# Patient Record
Sex: Male | Born: 1975 | Race: White | Hispanic: No | Marital: Married | Smoking: Never smoker
Health system: Southern US, Community
[De-identification: ages and names within clinical notes are randomized; demographics above are authoritative.]

## PROBLEM LIST (undated history)

## (undated) HISTORY — PX: NO PAST SURGERIES: SHX2092

---

## 2009-12-14 ENCOUNTER — Ambulatory Visit: Payer: Self-pay | Admitting: Internal Medicine

## 2016-11-29 ENCOUNTER — Ambulatory Visit (INDEPENDENT_AMBULATORY_CARE_PROVIDER_SITE_OTHER): Payer: BLUE CROSS/BLUE SHIELD

## 2016-11-29 ENCOUNTER — Ambulatory Visit
Admission: EM | Admit: 2016-11-29 | Discharge: 2016-11-29 | Disposition: A | Discharge status: Home / Self Care | Payer: BLUE CROSS/BLUE SHIELD | Attending: Family Medicine | Admitting: Family Medicine

## 2016-11-29 DIAGNOSIS — M25572 Pain in left ankle and joints of left foot: Secondary | ICD-10-CM | POA: Diagnosis not present

## 2016-11-29 MED ORDER — MELOXICAM 15 MG PO TABS: 15.0000 mg | ORAL_TABLET | Freq: Every day | ORAL | 0 refills | Status: AC | PRN

## 2016-11-29 NOTE — ED Provider Notes (Signed)
[facility] ____________________________________________  Time seen: Approximately 1:02 PM  I have reviewed the triage vital signs and the nursing notes.   HISTORY  Chief Complaint Foot Pain (left)  HPI Brent Adkins is a 41 y.o. male presenting for evaluation of left ankle pain for the last 2 days. Patient reports this past Saturday he was fishing all day with a lot of movement and stabilizing himself on the boat, but denies specific injury. Patient reports since Saturday evening after fishing he has been having left ankle pain. States pain is only with direct palpation or with walking. States if he sitting still he does not have any pain to his left ankle. Denies pain radiation, paresthesias, skin changes or rash. Denies any other joint pain. Denies fevers. Reports did have a tick bite on left abdomen 3 weeks ago but reports feeling well, no rash or other Complications. Patient reports pain at maximum is 5 out of 10. Denies previous issues to his ankle. Patient's spouse states he just wanted to make sure that no fracture. Patient does report he has a history of gout but states that this does not feel similar to previous gout.  Denies chest pain, shortness of breath, abdominal pain, dysuria.Denies recent sickness. Denies recent antibiotic use.   Kandyce Rud, MD: PCP   History reviewed. No pertinent past medical history.  There are no active problems to display for this patient.   Past Surgical History:  Procedure Laterality Date  . NO PAST SURGERIES       No current facility-administered medications for this encounter.   Current Outpatient Prescriptions:  .  meloxicam (MOBIC) 15 MG tablet, Take 1 tablet (15 mg total) by mouth daily as needed., Disp: 10 tablet, Rfl: 0  Allergies Patient has no known allergies.  History reviewed. No pertinent family history.  Social History Social History  Substance Use Topics  . Smoking status: Never Smoker  .  Smokeless tobacco: Never Used  . Alcohol use No    Review of Systems Constitutional: No fever/chills. Cardiovascular: Denies chest pain. Respiratory: Denies shortness of breath.  Genitourinary: Negative for dysuria. Musculoskeletal: Negative for back pain. As above. Skin: Negative for rash.  ____________________________________________   PHYSICAL EXAM:  VITAL SIGNS: ED Triage Vitals  Enc Vitals Group     BP 11/29/16 1149 130/84     Pulse Rate 11/29/16 1149 60     Resp 11/29/16 1149 16     Temp 11/29/16 1149 98.6 F (37 C)     Temp Source 11/29/16 1149 Oral     SpO2 11/29/16 1149 99 %     Weight 11/29/16 1148 212 lb (96.2 kg)     Height 11/29/16 1148 5\' 8"  (1.727 m)     Head Circumference --      Peak Flow --      Pain Score 11/29/16 1148 3     Pain Loc --      Pain Edu? --      Excl. in GC? --     Constitutional: Alert and oriented. Well appearing and in no acute distress. Cardiovascular: Normal rate, regular rhythm. Grossly normal heart sounds.  Good peripheral circulation. Respiratory: Normal respiratory effort without tachypnea nor retractions. Breath sounds are clear and equal bilaterally. No wheezes, rales, rhonchi. Musculoskeletal:  No midline cervical, thoracic or lumbar tenderness to palpation. Bilateral pedal pulses equal and easily palpated. except: Left lateral malleolus and immediate surrounding area mild tenderness to direct palpation, no swelling, no ecchymosis, pain with ankle  rotation, but full range of motion present, no pain with left foot plantar flexion or dorsiflexion, no erythema or rash. Left lower extremity otherwise nontender. Neurologic:  Normal speech and language. Speech is normal.  Skin:  Skin is warm, dry and intact. No rash noted. Psychiatric: Mood and affect are normal. Speech and behavior are normal. Patient exhibits appropriate insight and judgment.    ___________________________________________   LABS (all labs ordered are listed,  but only abnormal results are displayed)  Labs Reviewed - No data to display ____________________________________________  RADIOLOGY  Dg Ankle Complete Left  Result Date: 11/29/2016 CLINICAL DATA:  Pain for 2 days EXAM: LEFT ANKLE COMPLETE - 3+ VIEW COMPARISON:  None. FINDINGS: Frontal, oblique, and lateral views were obtained. There is mild soft tissue swelling. No acute fracture or joint effusion. Calcification in the medial malleolar region is well corticated and likely residua of old trauma in this area. There is no appreciable joint space narrowing or erosion. IMPRESSION: Probable old trauma medial malleolus with well corticated focus of ossification. No acute fracture or joint effusion. Ankle mortise appears intact. There is mild soft tissue swelling. Electronically Signed   By: Bretta BangWilliam  Woodruff III M.D.   On: 11/29/2016 13:24   ____________________________________________   PROCEDURES Procedures   Velcro stirrup splint applied by RN. INITIAL IMPRESSION / ASSESSMENT AND PLAN / ED COURSE  Pertinent labs & imaging results that were available during my care of the patient were reviewed by me and considered in my medical decision making (see chart for details).  Well appearing patient. No acute distress. Left ankle pain only with direct palpation and ambulation. Denies other pain or injuries. Left ankle x-ray per radiologist probable old trauma medial malleolus and well corticated focus of ossification, no acute fracture or joint effusion. Suspect sprain injury. No medial tenderness. Encouraged splint, rest, ice and elevation. Denies need for crutches.  Patient had reported recent tick bite healing well, discussed the patient doubt related to current joint pain, but discuss [facility], patient declined lab evaluation. Will treat with with oral daily Mobic. Discussed indication, risks and benefits of medications with patient.  Discussed follow up  with Primary care physician this week. Discussed follow up and return parameters including no resolution or any worsening concerns. Patient verbalized understanding and agreed to plan.   ____________________________________________   FINAL CLINICAL IMPRESSION(S) / ED DIAGNOSES  Final diagnoses:  Acute left ankle pain     Discharge Medication List as of 11/29/2016  1:42 PM    START taking these medications   Details  meloxicam (MOBIC) 15 MG tablet Take 1 tablet (15 mg total) by mouth daily as needed., Starting Mon 11/29/2016, Normal        Note: This dictation was prepared with Dragon dictation along with smaller phrase technology. Any transcriptional errors that result from this process are unintentional.         Renford DillsMiller, Jame Morrell, NP 11/29/16 1703

## 2016-11-29 NOTE — ED Triage Notes (Signed)
Patient complains of left foot pain underneath ankle. Patient states that this started on Saturday evening with no known injury.

## 2016-11-29 NOTE — Discharge Instructions (Signed)
Take medication as prescribed. Rest. Ice. Drink plenty of fluids.   Follow up with your primary care physician or above this week as needed. Return to Urgent care for new or worsening concerns.

## 2018-11-11 IMAGING — CR DG ANKLE COMPLETE 3+V*L*
3 series · 3 of 3 positions shown · non-contrast
Comparison: None.

CLINICAL DATA: Pain for 2 days

EXAM:
LEFT ANKLE COMPLETE - 3+ VIEW

[ankle ap]
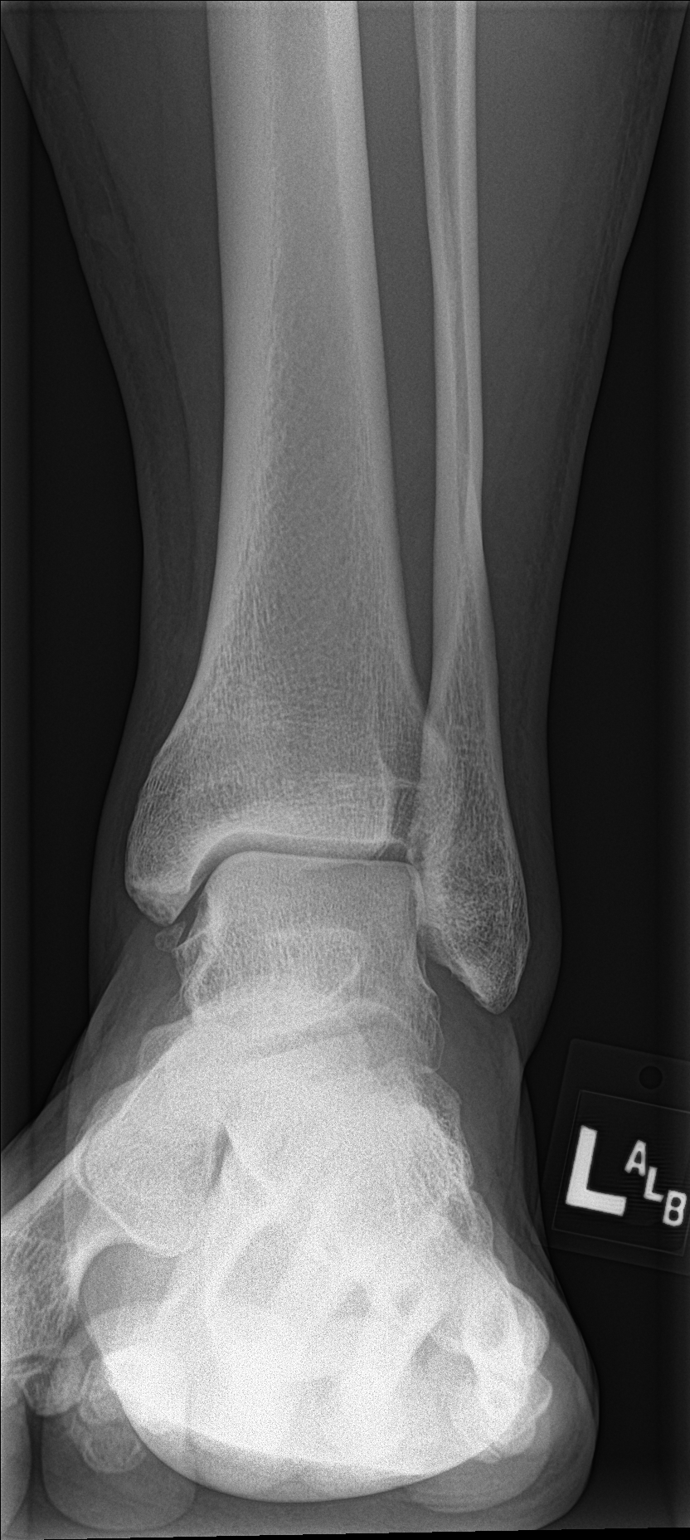

[ankle obl]
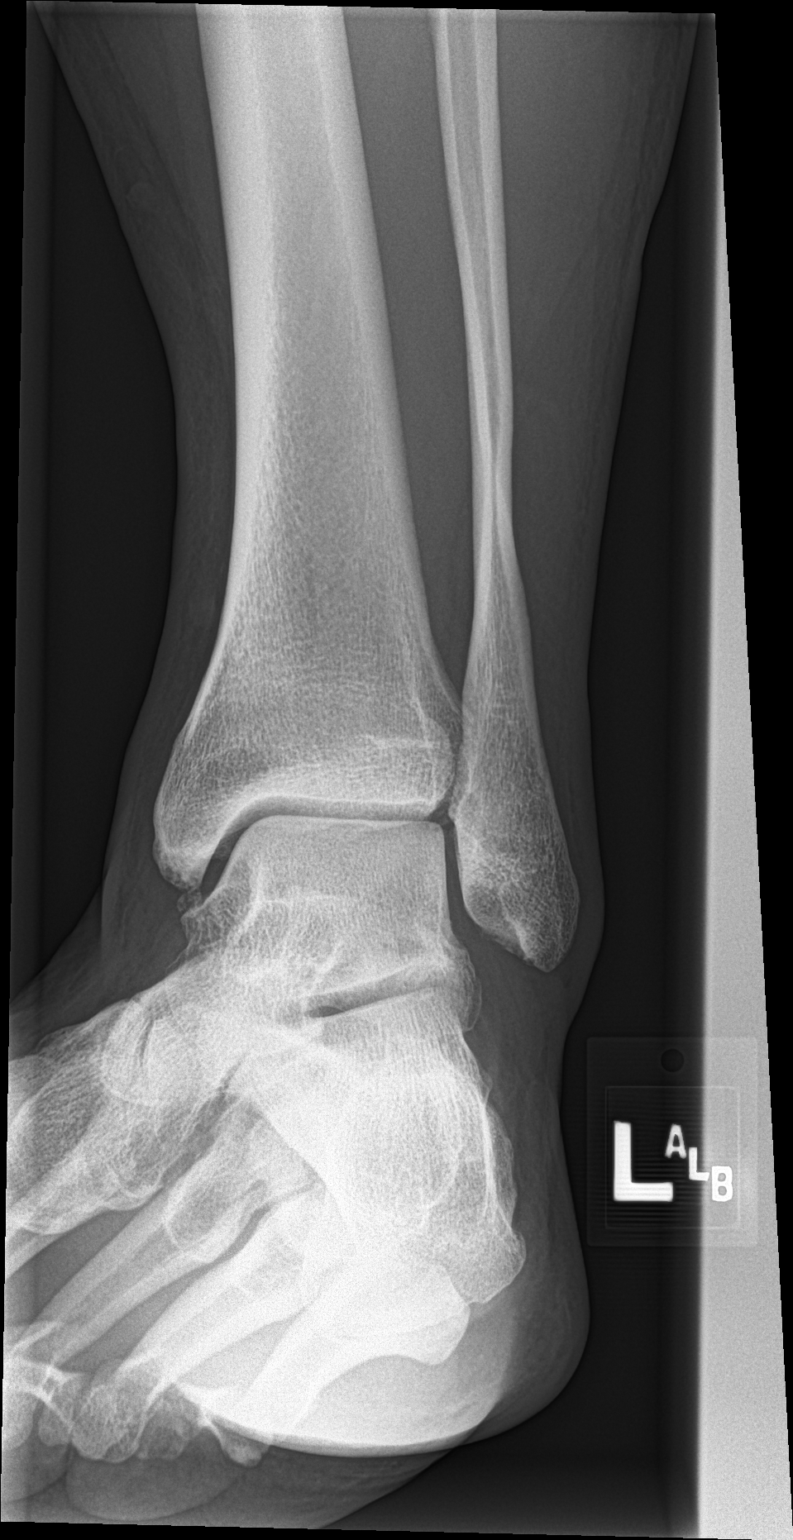

[ankle lat]
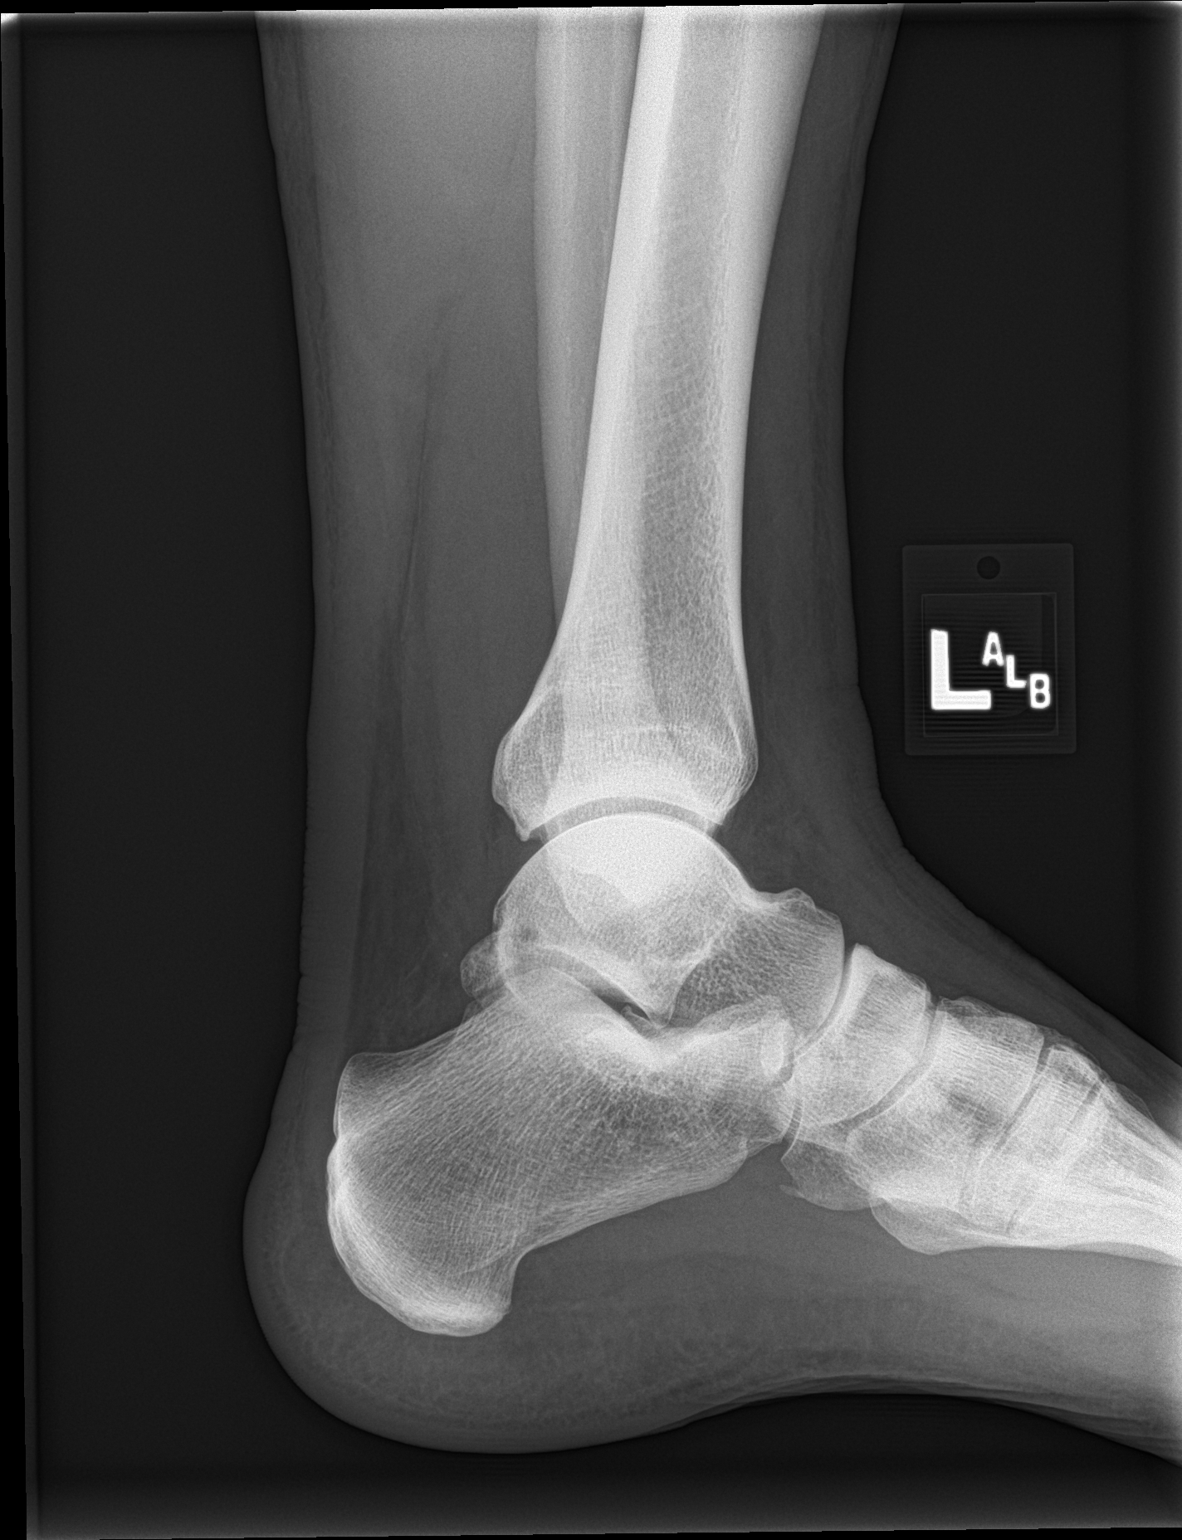

[3 of 3 positions shown; findings below may reference images not displayed]

FINDINGS: Frontal, oblique, and lateral views were obtained. There is mild
soft tissue swelling. No acute fracture or joint effusion.
Calcification in the medial malleolar region is well corticated and
likely residua of old trauma in this area. There is no appreciable
joint space narrowing or erosion.
IMPRESSION: Probable old trauma medial malleolus with well corticated focus of
ossification. No acute fracture or joint effusion. Ankle mortise
appears intact. There is mild soft tissue swelling.

## 2021-10-26 DIAGNOSIS — H524 Presbyopia: Secondary | ICD-10-CM | POA: Diagnosis not present

## 2023-02-08 DIAGNOSIS — M7661 Achilles tendinitis, right leg: Secondary | ICD-10-CM | POA: Diagnosis not present

## 2023-06-19 DIAGNOSIS — S0502XA Injury of conjunctiva and corneal abrasion without foreign body, left eye, initial encounter: Secondary | ICD-10-CM | POA: Diagnosis not present

## 2024-05-29 DIAGNOSIS — B078 Other viral warts: Secondary | ICD-10-CM | POA: Diagnosis not present
